# Patient Record
Sex: Male | Born: 1965 | Race: White | Hispanic: No | Marital: Married | Smoking: Never smoker
Health system: Southern US, Community
[De-identification: ages and names within clinical notes are randomized; demographics above are authoritative.]

## PROBLEM LIST (undated history)

## (undated) DIAGNOSIS — F909 Attention-deficit hyperactivity disorder, unspecified type: Secondary | ICD-10-CM

## (undated) DIAGNOSIS — G4733 Obstructive sleep apnea (adult) (pediatric): Secondary | ICD-10-CM

## (undated) HISTORY — PX: OTHER SURGICAL HISTORY: SHX169

## (undated) HISTORY — DX: Attention-deficit hyperactivity disorder, unspecified type: F90.9

## (undated) HISTORY — DX: Obstructive sleep apnea (adult) (pediatric): G47.33

---

## 2006-06-10 ENCOUNTER — Encounter: Admission: RE | Admit: 2006-06-10 | Discharge: 2006-06-10 | Payer: Self-pay | Admitting: Sports Medicine

## 2011-11-27 ENCOUNTER — Other Ambulatory Visit: Payer: Self-pay | Admitting: Internal Medicine

## 2011-11-27 DIAGNOSIS — R410 Disorientation, unspecified: Secondary | ICD-10-CM

## 2011-11-27 DIAGNOSIS — R519 Headache, unspecified: Secondary | ICD-10-CM

## 2011-11-29 ENCOUNTER — Ambulatory Visit
Admission: RE | Admit: 2011-11-29 | Discharge: 2011-11-29 | Disposition: A | Discharge status: Home / Self Care | Payer: BC Managed Care – PPO | Source: Ambulatory Visit | Attending: Internal Medicine | Admitting: Internal Medicine

## 2011-11-29 DIAGNOSIS — R519 Headache, unspecified: Secondary | ICD-10-CM

## 2011-11-29 DIAGNOSIS — R410 Disorientation, unspecified: Secondary | ICD-10-CM

## 2014-09-03 ENCOUNTER — Other Ambulatory Visit: Payer: Self-pay | Admitting: Internal Medicine

## 2014-09-03 DIAGNOSIS — R7989 Other specified abnormal findings of blood chemistry: Secondary | ICD-10-CM

## 2014-09-06 ENCOUNTER — Ambulatory Visit
Admission: RE | Admit: 2014-09-06 | Discharge: 2014-09-06 | Disposition: A | Discharge status: Home / Self Care | Payer: BC Managed Care – PPO | Source: Ambulatory Visit | Attending: Internal Medicine | Admitting: Internal Medicine

## 2014-09-06 DIAGNOSIS — R7989 Other specified abnormal findings of blood chemistry: Secondary | ICD-10-CM

## 2016-12-28 DIAGNOSIS — H5213 Myopia, bilateral: Secondary | ICD-10-CM | POA: Diagnosis not present

## 2017-03-25 DIAGNOSIS — Z79899 Other long term (current) drug therapy: Secondary | ICD-10-CM | POA: Diagnosis not present

## 2017-06-05 DIAGNOSIS — Z125 Encounter for screening for malignant neoplasm of prostate: Secondary | ICD-10-CM | POA: Diagnosis not present

## 2017-06-05 DIAGNOSIS — Z0001 Encounter for general adult medical examination with abnormal findings: Secondary | ICD-10-CM | POA: Diagnosis not present

## 2017-06-05 DIAGNOSIS — M109 Gout, unspecified: Secondary | ICD-10-CM | POA: Diagnosis not present

## 2017-06-11 DIAGNOSIS — Z0000 Encounter for general adult medical examination without abnormal findings: Secondary | ICD-10-CM | POA: Diagnosis not present

## 2017-06-11 DIAGNOSIS — N182 Chronic kidney disease, stage 2 (mild): Secondary | ICD-10-CM | POA: Diagnosis not present

## 2017-06-11 DIAGNOSIS — E785 Hyperlipidemia, unspecified: Secondary | ICD-10-CM | POA: Diagnosis not present

## 2017-06-21 DIAGNOSIS — Z79899 Other long term (current) drug therapy: Secondary | ICD-10-CM | POA: Diagnosis not present

## 2017-10-07 DIAGNOSIS — Z79899 Other long term (current) drug therapy: Secondary | ICD-10-CM | POA: Diagnosis not present

## 2017-11-06 DIAGNOSIS — R05 Cough: Secondary | ICD-10-CM | POA: Diagnosis not present

## 2017-11-06 DIAGNOSIS — J019 Acute sinusitis, unspecified: Secondary | ICD-10-CM | POA: Diagnosis not present

## 2018-01-06 DIAGNOSIS — S76311A Strain of muscle, fascia and tendon of the posterior muscle group at thigh level, right thigh, initial encounter: Secondary | ICD-10-CM | POA: Diagnosis not present

## 2018-02-04 DIAGNOSIS — Z79899 Other long term (current) drug therapy: Secondary | ICD-10-CM | POA: Diagnosis not present

## 2018-02-13 DIAGNOSIS — J309 Allergic rhinitis, unspecified: Secondary | ICD-10-CM | POA: Diagnosis not present

## 2018-02-13 DIAGNOSIS — J45909 Unspecified asthma, uncomplicated: Secondary | ICD-10-CM | POA: Diagnosis not present

## 2018-05-20 DIAGNOSIS — Z79899 Other long term (current) drug therapy: Secondary | ICD-10-CM | POA: Diagnosis not present

## 2018-06-03 DIAGNOSIS — N182 Chronic kidney disease, stage 2 (mild): Secondary | ICD-10-CM | POA: Diagnosis not present

## 2018-06-03 DIAGNOSIS — Z0000 Encounter for general adult medical examination without abnormal findings: Secondary | ICD-10-CM | POA: Diagnosis not present

## 2018-06-03 DIAGNOSIS — E785 Hyperlipidemia, unspecified: Secondary | ICD-10-CM | POA: Diagnosis not present

## 2018-06-06 DIAGNOSIS — Z0001 Encounter for general adult medical examination with abnormal findings: Secondary | ICD-10-CM | POA: Diagnosis not present

## 2018-06-06 DIAGNOSIS — D179 Benign lipomatous neoplasm, unspecified: Secondary | ICD-10-CM | POA: Diagnosis not present

## 2018-06-06 DIAGNOSIS — Z1212 Encounter for screening for malignant neoplasm of rectum: Secondary | ICD-10-CM | POA: Diagnosis not present

## 2018-06-06 DIAGNOSIS — M109 Gout, unspecified: Secondary | ICD-10-CM | POA: Diagnosis not present

## 2018-06-11 DIAGNOSIS — N183 Chronic kidney disease, stage 3 (moderate): Secondary | ICD-10-CM | POA: Diagnosis not present

## 2018-06-11 DIAGNOSIS — M109 Gout, unspecified: Secondary | ICD-10-CM | POA: Diagnosis not present

## 2018-07-09 DIAGNOSIS — Z79899 Other long term (current) drug therapy: Secondary | ICD-10-CM | POA: Diagnosis not present

## 2018-07-09 DIAGNOSIS — E785 Hyperlipidemia, unspecified: Secondary | ICD-10-CM | POA: Diagnosis not present

## 2018-07-09 DIAGNOSIS — M109 Gout, unspecified: Secondary | ICD-10-CM | POA: Diagnosis not present

## 2018-09-05 DIAGNOSIS — B079 Viral wart, unspecified: Secondary | ICD-10-CM | POA: Diagnosis not present

## 2018-09-10 DIAGNOSIS — M109 Gout, unspecified: Secondary | ICD-10-CM | POA: Diagnosis not present

## 2018-12-09 DIAGNOSIS — M109 Gout, unspecified: Secondary | ICD-10-CM | POA: Diagnosis not present

## 2018-12-09 DIAGNOSIS — J069 Acute upper respiratory infection, unspecified: Secondary | ICD-10-CM | POA: Diagnosis not present

## 2018-12-16 DIAGNOSIS — J069 Acute upper respiratory infection, unspecified: Secondary | ICD-10-CM | POA: Diagnosis not present

## 2019-01-19 DIAGNOSIS — H9201 Otalgia, right ear: Secondary | ICD-10-CM | POA: Diagnosis not present

## 2019-01-19 DIAGNOSIS — M26609 Unspecified temporomandibular joint disorder, unspecified side: Secondary | ICD-10-CM | POA: Diagnosis not present

## 2019-02-06 DIAGNOSIS — R6884 Jaw pain: Secondary | ICD-10-CM | POA: Diagnosis not present

## 2019-02-06 DIAGNOSIS — H9201 Otalgia, right ear: Secondary | ICD-10-CM | POA: Diagnosis not present

## 2019-03-09 DIAGNOSIS — R5383 Other fatigue: Secondary | ICD-10-CM | POA: Diagnosis not present

## 2019-03-09 DIAGNOSIS — Z0001 Encounter for general adult medical examination with abnormal findings: Secondary | ICD-10-CM | POA: Diagnosis not present

## 2019-03-09 DIAGNOSIS — Z125 Encounter for screening for malignant neoplasm of prostate: Secondary | ICD-10-CM | POA: Diagnosis not present

## 2019-03-12 DIAGNOSIS — Z20828 Contact with and (suspected) exposure to other viral communicable diseases: Secondary | ICD-10-CM | POA: Diagnosis not present

## 2019-03-12 DIAGNOSIS — Z0000 Encounter for general adult medical examination without abnormal findings: Secondary | ICD-10-CM | POA: Diagnosis not present

## 2019-03-12 DIAGNOSIS — Z1212 Encounter for screening for malignant neoplasm of rectum: Secondary | ICD-10-CM | POA: Diagnosis not present

## 2019-03-12 DIAGNOSIS — M109 Gout, unspecified: Secondary | ICD-10-CM | POA: Diagnosis not present

## 2019-03-12 DIAGNOSIS — Z23 Encounter for immunization: Secondary | ICD-10-CM | POA: Diagnosis not present

## 2019-03-13 DIAGNOSIS — B351 Tinea unguium: Secondary | ICD-10-CM | POA: Diagnosis not present

## 2019-03-13 DIAGNOSIS — M109 Gout, unspecified: Secondary | ICD-10-CM | POA: Diagnosis not present

## 2019-03-13 DIAGNOSIS — Z0000 Encounter for general adult medical examination without abnormal findings: Secondary | ICD-10-CM | POA: Diagnosis not present

## 2019-03-13 DIAGNOSIS — Z20828 Contact with and (suspected) exposure to other viral communicable diseases: Secondary | ICD-10-CM | POA: Diagnosis not present

## 2019-09-14 ENCOUNTER — Other Ambulatory Visit: Payer: Self-pay

## 2019-09-14 DIAGNOSIS — Z20822 Contact with and (suspected) exposure to covid-19: Secondary | ICD-10-CM

## 2019-09-15 LAB — NOVEL CORONAVIRUS, NAA: SARS-CoV-2, NAA: NOT DETECTED

## 2021-03-27 ENCOUNTER — Other Ambulatory Visit: Payer: Self-pay | Admitting: Internal Medicine

## 2021-03-27 DIAGNOSIS — E785 Hyperlipidemia, unspecified: Secondary | ICD-10-CM

## 2021-04-17 ENCOUNTER — Ambulatory Visit
Admission: RE | Admit: 2021-04-17 | Discharge: 2021-04-17 | Disposition: A | Discharge status: Home / Self Care | Payer: No Typology Code available for payment source | Source: Ambulatory Visit | Attending: Internal Medicine | Admitting: Internal Medicine

## 2021-04-17 DIAGNOSIS — E785 Hyperlipidemia, unspecified: Secondary | ICD-10-CM

## 2021-06-19 DIAGNOSIS — R000 Tachycardia, unspecified: Secondary | ICD-10-CM | POA: Insufficient documentation

## 2021-06-19 NOTE — Progress Notes (Signed)
Cardiology Office Note   Date:  06/20/2021   ID:  Greg Cunningham, DOB October 01, 1966, MRN [id number]  PCP:  Greg Pretty, MD  Cardiologist:   None Referring:  Greg Pretty, MD  Chief Complaint  Patient presents with   Palpitations       History of Present Illness: Greg Cunningham is a 55 y.o. male who is referred by Greg Pretty, MD for evaluaiton of tachycardia.  He wore a Zio.   I was able to review these results.  The predominant rhythm was sinus.  There was some premature atrial contractions.  There was 1 run of 5 beats of tachycardia which did appear to have a slightly wider complexes and some other runs of supraventricular tachycardia.  This might have represented 5 beats of nonsustained ventricular tachycardia.  There was some atrial ectopy.  There were no sustained arrhythmias.  He had a recent calcium score that was zero.    He says that his symptoms have not been palpitations in his neck.  He is really noticed on his Fitbit that it says atrial fibrillation but he does not record a rhythm strip.  He has had episodes where his heart rate is recorded in the 50s but is typically 100.  He said that might happen suddenly.  He does not really have presyncope or syncope.  He does not really describe chest pressure, neck or arm discomfort.  He is able to work vigorously in the yard without the symptoms.  He has had normal blood pressures.  He has had no new shortness of breath, PND or orthopnea.  He has had no weight gain or edema.   Past Medical History:  Diagnosis Date   ADHD     Past Surgical History:  Procedure Laterality Date   None       Current Outpatient Medications  Medication Sig Dispense Refill   Amphetamine ER (ADZENYS XR-ODT) 18.8 MG TBED Take 1 tablet by mouth every morning.     Biotin 10 MG TABS Take 10 mg by mouth every other day. 1 Tablet Every Other Day     magnesium oxide (MAG-OX) 400 MG tablet Take 400 mg by mouth every other day. 1 Tablet Every Other Day      melatonin 1 MG TABS tablet Take 1 mg by mouth at bedtime.     allopurinol (ZYLOPRIM) 300 MG tablet Take 300 mg by mouth daily.     Ascorbic Acid (VITAMIN C) 500 MG CAPS See admin instructions.     ciclopirox (PENLAC) 8 % solution 1 application     fluticasone furoate-vilanterol (BREO ELLIPTA) 100-25 MCG/INH AEPB INHALE ONE PUFF BY MOUTH DAILY     loratadine (CLARITIN) 10 MG tablet 1 tablet     No current facility-administered medications for this visit.    Allergies:   Patient has no allergy information on record.    Social History:  The patient  reports that he has never smoked. He has never been exposed to tobacco smoke. He has never used smokeless tobacco. He reports that he does not use drugs.   Family History:  The patient's family history is not on file.   No early CAD but he does not know his father history.     ROS:  Please see the history of present illness.   Otherwise, review of systems are positive for none.   All other systems are reviewed and negative.    PHYSICAL EXAM: VS:  BP 124/78   Pulse  93   Ht '5\' 9"'$  (1.753 m)   Wt 201 lb (91.2 kg)   SpO2 99%   BMI 29.68 kg/m  , BMI Body mass index is 29.68 kg/m. GENERAL:  Well appearing HEENT:  Pupils equal round and reactive, fundi not visualized, oral mucosa unremarkable NECK:  No jugular venous distention, waveform within normal limits, carotid upstroke brisk and symmetric, no bruits, no thyromegaly LYMPHATICS:  No cervical, inguinal adenopathy LUNGS:  Clear to auscultation bilaterally BACK:  No CVA tenderness CHEST:  Unremarkable HEART:  PMI not displaced or sustained,S1 and S2 within normal limits, no S3, no S4, no clicks, no rubs, no murmurs ABD:  Flat, positive bowel sounds normal in frequency in pitch, no bruits, no rebound, no guarding, no midline pulsatile mass, no hepatomegaly, no splenomegaly EXT:  2 plus pulses throughout, no edema, no cyanosis no clubbing SKIN:  No rashes no nodules NEURO:  Cranial  nerves II through XII grossly intact, motor grossly intact throughout PSYCH:  Cognitively intact, oriented to person place and time    EKG:  EKG is ordered today. The ekg ordered today demonstrates   Sinus rhythm, rate 93, axis within normal limits, intervals within normal limits, no acute ST-T wave changes.   Recent Labs: No results found for requested labs within last 8760 hours.    Lipid Panel No results found for: CHOL, TRIG, HDL, CHOLHDL, VLDL, LDLCALC, LDLDIRECT    Wt Readings from Last 3 Encounters:  06/20/21 201 lb (91.2 kg)      Other studies Reviewed: Additional studies/ records that were reviewed today include:   Event monitor. Review of the above records demonstrates:  Please see elsewhere in the note.     ASSESSMENT AND PLAN:  TACHYCARDIA: The patient has had SVT.  He has had very brief episodes of sinus bradycardia.  He has had some very brief wide-complex he might of been SVT or 5 beats of nonsustained ventricular tachycardia.  He has had atrial bigeminy which is probably explaining some of the sudden lower heart rates.  This is probably just and under counting of these ectopic beats.  We had a long discussion about this.  I am going to check an echocardiogram to make sure he has a structurally normal heart.  He is going to check with his primary provider to make sure he has had a recent TSH.  If these are normal I will plan to manage this symptomatically.  Toward that and he would probably want a first see if he should take another stimulant as this may be contributing.  Otherwise we might consider beta-blocker or calcium channel blocker and he will let me know.  At this point I do not have any evidence that he has atrial fibrillation and he be low thromboembolic risk if that was the case.  We did discuss him getting a Kardia's   Current medicines are reviewed at length with the patient today.  The patient does not have concerns regarding medicines.  The following  changes have been made: Patient  Labs/ tests ordered today include:   Orders Placed This Encounter  Procedures   EKG 12-Lead   ECHOCARDIOGRAM COMPLETE      Disposition:   FU with me as needed.      Signed, Minus Breeding, MD  06/20/2021 5:42 PM    [facility]

## 2021-06-20 ENCOUNTER — Other Ambulatory Visit: Payer: Self-pay

## 2021-06-20 ENCOUNTER — Ambulatory Visit (INDEPENDENT_AMBULATORY_CARE_PROVIDER_SITE_OTHER): Payer: 59 | Admitting: Cardiology

## 2021-06-20 ENCOUNTER — Encounter: Payer: Self-pay | Admitting: Cardiology

## 2021-06-20 VITALS — BP 124/78 | HR 93 | Ht 69.0 in | Wt 201.0 lb

## 2021-06-20 DIAGNOSIS — R000 Tachycardia, unspecified: Secondary | ICD-10-CM | POA: Diagnosis not present

## 2021-06-20 DIAGNOSIS — R002 Palpitations: Secondary | ICD-10-CM | POA: Diagnosis not present

## 2021-06-20 NOTE — Patient Instructions (Signed)
Medication Instructions:  No Changes In Medications at this time.  *If you need a refill on your cardiac medications before your next appointment, please call your pharmacy*  Testing/Procedures: Your physician has requested that you have an echocardiogram. Echocardiography is a painless test that uses sound waves to create images of your heart. It provides your doctor with information about the size and shape of your heart and how well your heart's chambers and valves are working. You may receive an ultrasound enhancing agent through an IV if needed to better visualize your heart during the echo.This procedure takes approximately one hour. There are no restrictions for this procedure. This will take place at the [address].   Follow-Up: At [facility], you and your health needs are our priority.  As part of our continuing mission to provide you with exceptional heart care, we have created designated Provider Care Teams.  These Care Teams include your primary Cardiologist (physician) and Advanced Practice Providers (APPs -  Physician Assistants and Nurse Practitioners) who all work together to provide you with the care you need, when you need it.  Your next appointment:   AS NEEDED   The format for your next appointment:   In Person  Provider:   Minus Breeding, MD

## 2021-06-22 ENCOUNTER — Ambulatory Visit (HOSPITAL_COMMUNITY): Payer: 59 | Attending: Cardiovascular Disease

## 2021-06-22 ENCOUNTER — Other Ambulatory Visit: Payer: Self-pay

## 2021-06-22 DIAGNOSIS — R002 Palpitations: Secondary | ICD-10-CM | POA: Diagnosis present

## 2021-06-22 DIAGNOSIS — R000 Tachycardia, unspecified: Secondary | ICD-10-CM | POA: Insufficient documentation

## 2021-06-22 LAB — ECHOCARDIOGRAM COMPLETE
Area-P 1/2: 3.24 cm2
S' Lateral: 2.2 cm

## 2021-06-28 ENCOUNTER — Encounter: Payer: Self-pay | Admitting: *Deleted

## 2021-06-30 ENCOUNTER — Ambulatory Visit: Payer: 59 | Admitting: Cardiology

## 2021-11-07 IMAGING — CT CT CARDIAC CORONARY ARTERY CALCIUM SCORE
3 series · 14 of 20 positions shown, 16 images · non-contrast
Comparison: No priors.

CLINICAL DATA: 55-year-old Caucasian male with history of elevated
cholesterol.

EXAM:
CT CARDIAC CORONARY ARTERY CALCIUM SCORE
TECHNIQUE: Non-contrast imaging through the heart was performed using
prospective ECG gating. Image post processing was performed on an
independent workstation, allowing for quantitative analysis of the
heart and coronary arteries. Note that this exam targets the heart
and the chest was not imaged in its entirety.

[Series 2: calcium scoring 2.00 qr36 bestdiast 70% hrt calciu · axial · 0.34mm/px · z∈[+1553,+1637]mm · 4 of 72 slices shown]
[im 15/72  vessel]
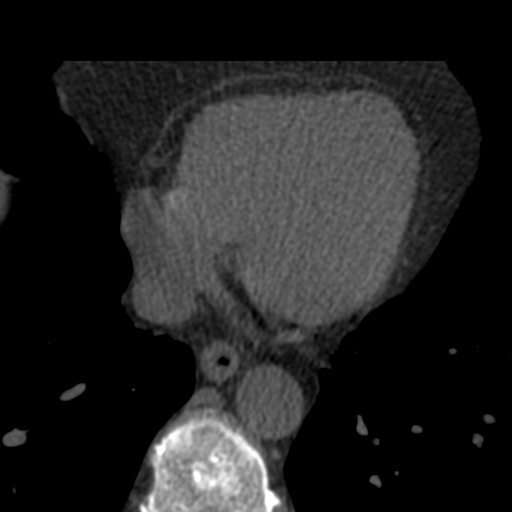
[im 29/72  vessel]
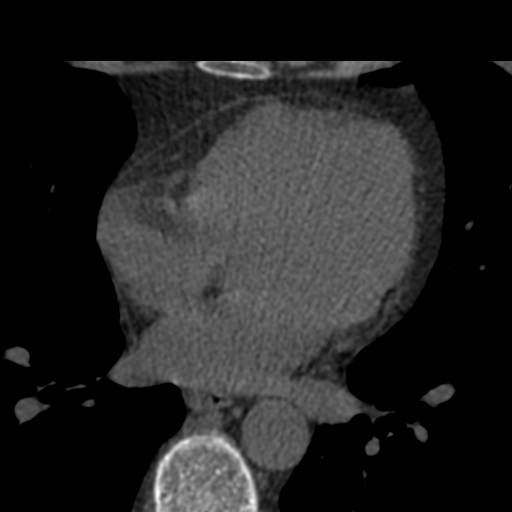
[im 43/72  vessel]
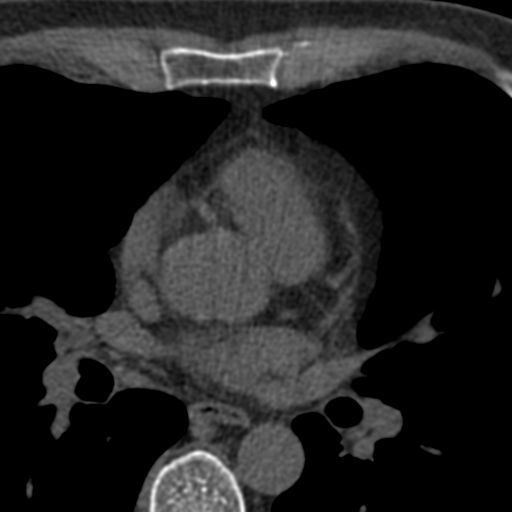
[im 57/72  vessel]
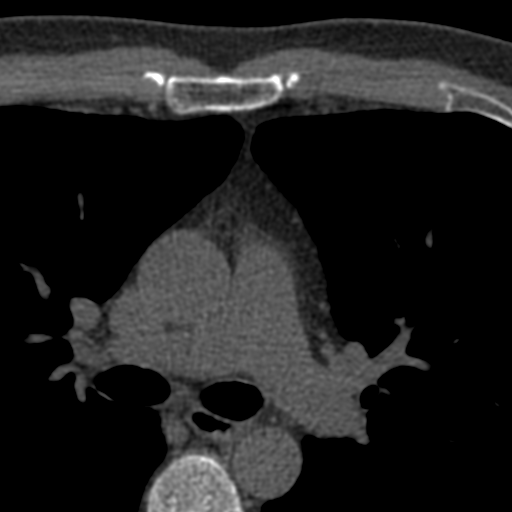

[Series 3: calcium scoring 2.00 br40 bestdiast 70% axial · axial · 0.60mm/px · z∈[+1547,+1643]mm · 5 of 72 slices shown, 7 images]
[im 12/72  vessel]
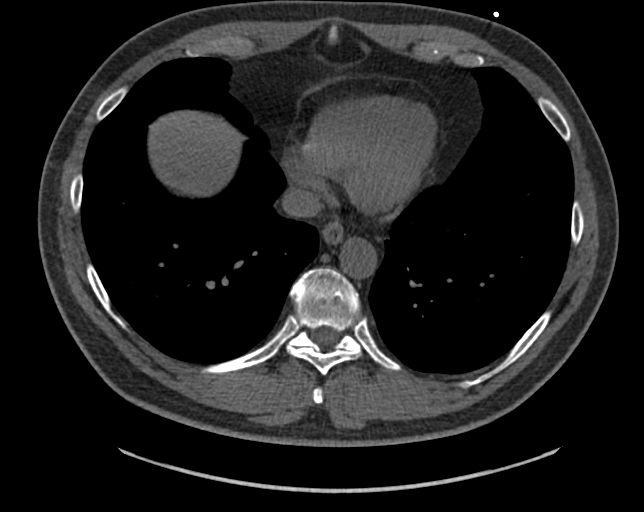
[im 12/72  lung]
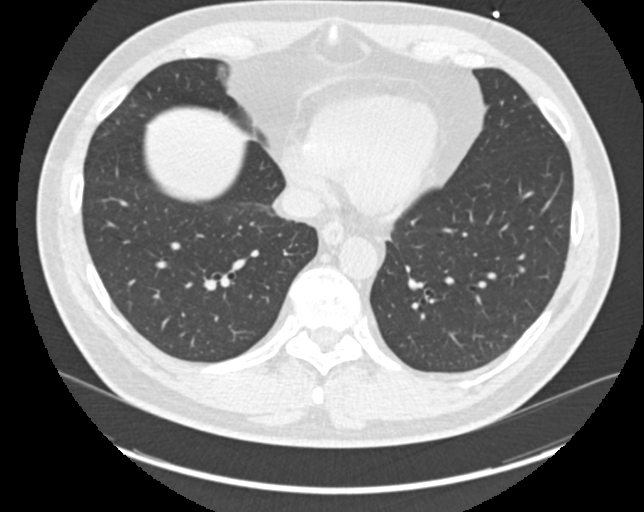
[im 24/72  vessel]
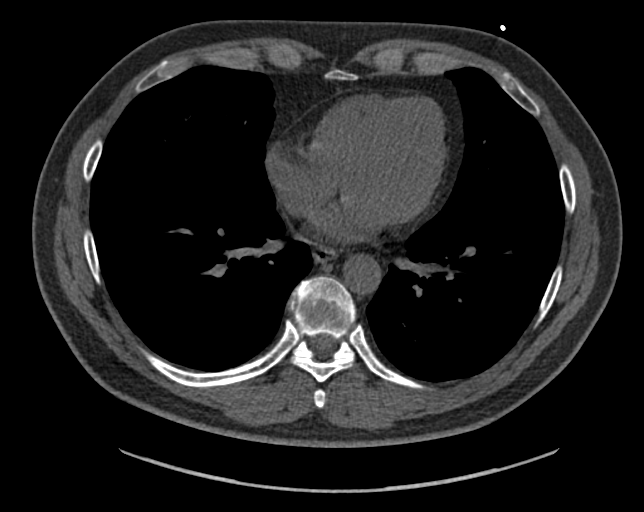
[im 36/72  vessel]
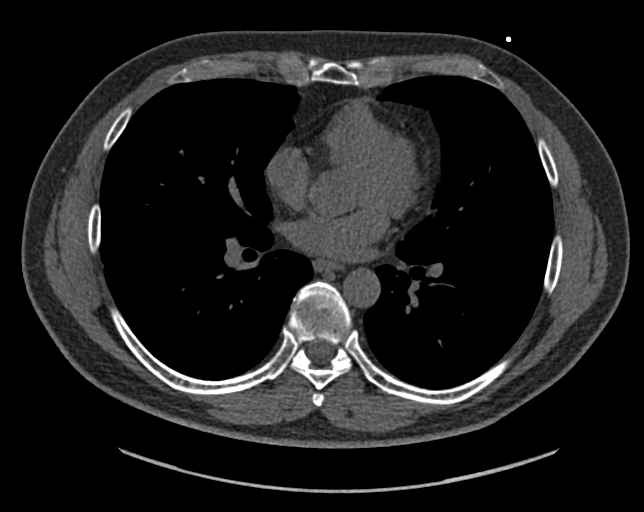
[im 48/72  vessel]
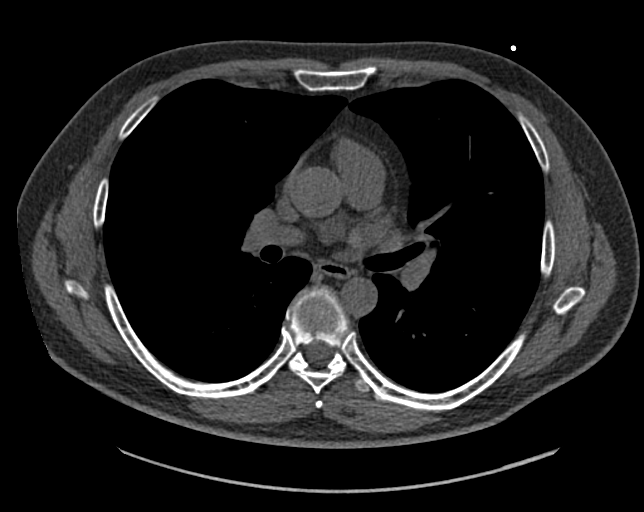
[im 60/72  vessel]
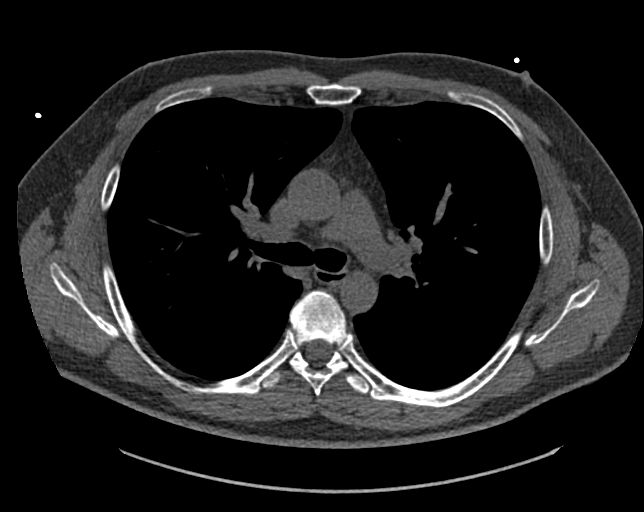
[im 60/72  lung]
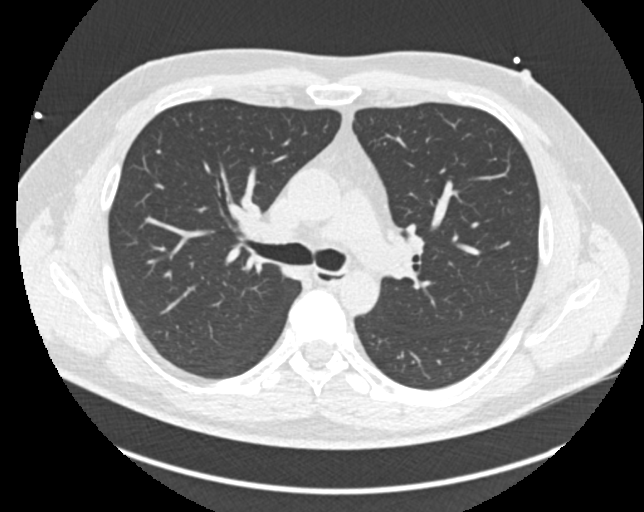

[Series 9: calcium scoring 2.00 br60 bestdiast 70% lungs · axial · 0.60mm/px · z∈[+1547,+1643]mm · 5 of 72 slices shown]
[im 12/72  vessel]
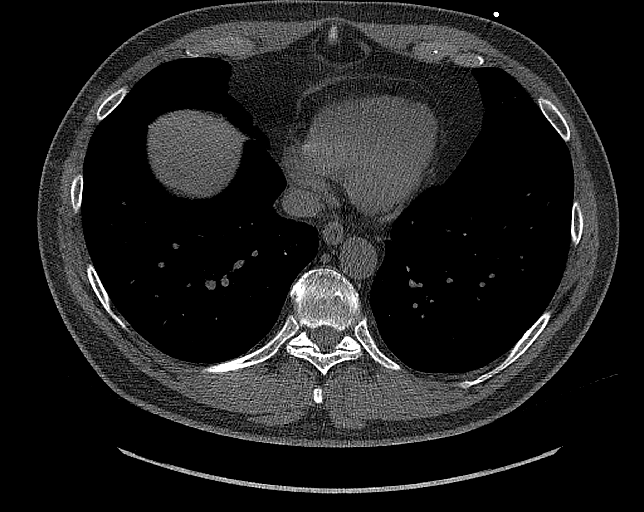
[im 24/72  vessel]
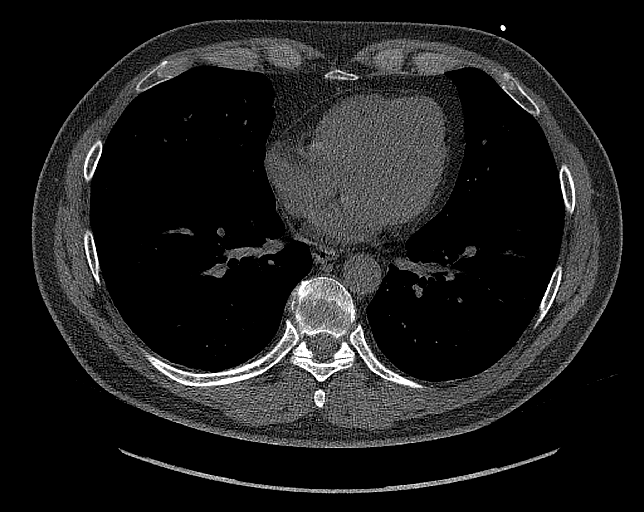
[im 36/72  vessel]
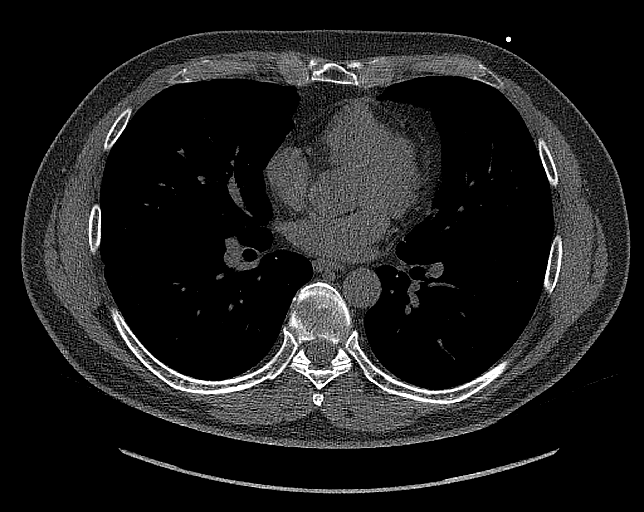
[im 48/72  vessel]
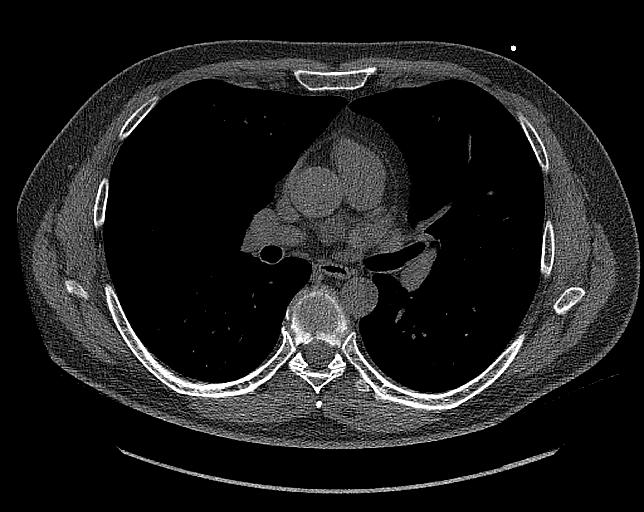
[im 60/72  vessel]
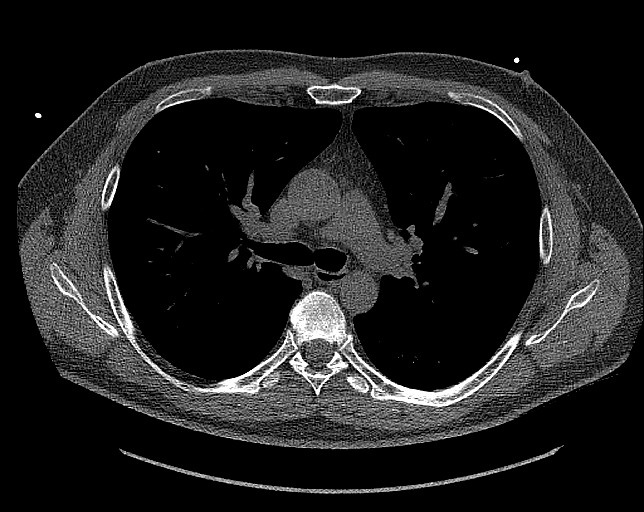

[14 of 20 positions shown; findings below may reference images not displayed]

FINDINGS: CORONARY CALCIUM SCORES:

Left Main: 0

LAD: 0

LCx: 0

RCA: 0

Total Agatston Score: 0

[HOSPITAL] percentile: N/A

AORTA MEASUREMENTS:

Ascending Aorta: 31 mm

Descending Aorta: 23 mm

EXTRACARDIAC FINDINGS:

2 mm pulmonary nodule in the periphery of the right lower lobe
(axial image 66 of series 3). Within the visualized portions of the
thorax there are no suspicious appearing pulmonary nodules or
masses, there is no acute consolidative airspace disease, no pleural
effusions, no pneumothorax and no lymphadenopathy. Visualized
portions of the upper abdomen are unremarkable. Well-circumscribed
2.3 cm fatty attenuation lesion in the right infraspinatus
musculature, compatible with an intramuscular lipoma. There are no
aggressive appearing lytic or blastic lesions noted in the
visualized portions of the skeleton.
IMPRESSION: 1. Patient's total coronary artery calcium score is 0 which
indicates a very low (but nonzero) risk of major adverse
cardiovascular events over the next 10 years.
2. 2 mm pulmonary nodule in the periphery of the right lower lobe,
nonspecific, but statistically likely benign. No follow-up needed if
patient is low-risk. Non-contrast chest CT can be considered in 12
months if patient is high-risk. This recommendation follows the
consensus statement: Guidelines for Management of Incidental
Pulmonary Nodules Detected on CT Images: From the [HOSPITAL]

## 2022-01-29 ENCOUNTER — Encounter: Payer: Self-pay | Admitting: Physician Assistant

## 2022-01-29 ENCOUNTER — Other Ambulatory Visit: Payer: Self-pay

## 2022-01-29 ENCOUNTER — Ambulatory Visit (INDEPENDENT_AMBULATORY_CARE_PROVIDER_SITE_OTHER): Payer: 59 | Admitting: Physician Assistant

## 2022-01-29 VITALS — BP 133/87 | HR 96 | Ht 68.0 in | Wt 204.6 lb

## 2022-01-29 DIAGNOSIS — I480 Paroxysmal atrial fibrillation: Secondary | ICD-10-CM

## 2022-01-29 DIAGNOSIS — F909 Attention-deficit hyperactivity disorder, unspecified type: Secondary | ICD-10-CM | POA: Diagnosis not present

## 2022-01-29 MED ORDER — METOPROLOL TARTRATE 25 MG PO TABS: ORAL_TABLET | ORAL | 3 refills | Status: DC

## 2022-01-29 NOTE — Patient Instructions (Addendum)
Medication Instructions:  ?TAKE Metoprolol Tartrate (Lopressor) 12.5 mg (1 tablet) every 6 hours for 2 doses during the daytime, may take additional 12.5 mg tablet at night if recurrent A-fib ?*If you need a refill on your cardiac medications before your next appointment, please call your pharmacy* ? ?Lab Work: ?NONE ordered at this time of appointment  ? ?If you have labs (blood work) drawn today and your tests are completely normal, you will receive your results only by: ?MyChart Message (if you have MyChart) OR ?A paper copy in the mail ?If you have any lab test that is abnormal or we need to change your treatment, we will call you to review the results. ? ?Testing/Procedures: ?NONE ordered at this time of appointment  ? ?Follow-Up: ?At [facility], you and your health needs are our priority.  As part of our continuing mission to provide you with exceptional heart care, we have created designated Provider Care Teams.  These Care Teams include your primary Cardiologist (physician) and Advanced Practice Providers (APPs -  Physician Assistants and Nurse Practitioners) who all work together to provide you with the care you need, when you need it. ? ?Your next appointment:   ?6 week(s) ? ?The format for your next appointment:   ?In Person ? ?Provider:   ?Minus Breeding, MD  or Almyra Deforest, PA-C      ? ?Other Instructions ?DO NOT drink any caffeine or alcohol drinks/beverages  ? ?

## 2022-01-29 NOTE — Progress Notes (Signed)
?Cardiology Office Note:   ? ?Date:  01/31/2022  ? ?ID:  Greg Cunningham, DOB June 03, 1966, MRN [id number] ? ?PCP:  [name], MD ?  ?[facility] Providers ?Cardiologist:  Minus Breeding, MD    ? ?Referring MD: [name], MD  ? ?Chief Complaint  ?Patient presents with  ? Irregular Heart Beat  ? ? ?History of Present Illness:   ? ?Greg Cunningham is a 56 y.o. male with a hx of ADHD and palpitation.  Patient was initially referred to [name] by his PCP for evaluation of tachycardia.  ZIO monitor showed predominantly sinus rhythm with premature atrial contraction, single episode of 5 beats of tachycardia which appears to have wider complex and some other runs of SVT.  There was no sustained arrhythmia.  Coronary calcium score was 0.  He was last seen by [name] in August 2022 at which time he was felt to be stable.  There was no limitation to his functional ability.  Echocardiogram obtained on 06/22/2021 showed EF 60 to 65%, no regional wall motion abnormality, no significant valve issue. ? ?Patient presents today for evaluation of possible atrial fibrillation.  EKG strip obtained based on his Apple Watch 01/25/2022 did reveal atrial fibrillation.  Rate controlled with heart rate in the 90 to low 100 range.  He says the palpitation lasted over an hour.  I reviewed the atrial fibrillation with [name], given his low CHA2DS2-Vasc score, we will hold off on starting him on anticoagulation therapy.  He does not have any history of high blood pressure, diabetes, peripheral arterial disease or CAD.  Heart rate sometimes dipped down to the 30s to 40s at night.  After discussing with [name], we recommended 2 doses of 12.5 mg of metoprolol tartrate during the day separated by 6 hours.  We will hold off on adding any metoprolol tartrate at night.  Although the patient may have the option of taking an extra dose of 12.5 mg of metoprolol tartrate if he does have recurrent A-fib at night.  I plan to see the  patient back in 6 weeks for reassessment.  [name] prefer not to add a chronic flecainide therapy for this patient if at all possible.  He may be a candidate for pill in the pocket flecainide in the future if he has prolonged episode of A-fib.  If he does have recurrence of A-fib, [name] would prefer to refer him to EP service to consider ablation procedure given his young age. ? ?Past Medical History:  ?Diagnosis Date  ? ADHD   ? ? ?Past Surgical History:  ?Procedure Laterality Date  ? None    ? ? ?Current Medications: ?Current Meds  ?Medication Sig  ? allopurinol (ZYLOPRIM) 300 MG tablet Take 300 mg by mouth daily.  ? Amphetamine ER (ADZENYS XR-ODT) 18.8 MG TBED Take 1 tablet by mouth every morning.  ? Ascorbic Acid (VITAMIN C) 500 MG CAPS See admin instructions.  ? Biotin 10 MG TABS Take 10 mg by mouth every other day. 1 Tablet Every Other Day  ? ciclopirox (PENLAC) 8 % solution 1 application  ? fluticasone furoate-vilanterol (BREO ELLIPTA) 100-25 MCG/INH AEPB INHALE ONE PUFF BY MOUTH DAILY  ? loratadine (CLARITIN) 10 MG tablet 1 tablet  ? melatonin 1 MG TABS tablet Take 1 mg by mouth at bedtime.  ? metoprolol tartrate (LOPRESSOR) 25 MG tablet Take 1 tablet every 6 hours for 2 doses during the daytime, may take additional 12.5 mg tablet at  night if recurrent A-fib  ?  ? ?Allergies:   Patient has no allergy information on record.  ? ?Social History  ? ?Socioeconomic History  ? Marital status: Married  ?  Spouse name: Not on file  ? Number of children: Not on file  ? Years of education: Not on file  ? Highest education level: Not on file  ?Occupational History  ? Not on file  ?Tobacco Use  ? Smoking status: Never  ?  Passive exposure: Never  ? Smokeless tobacco: Never  ?Substance and Sexual Activity  ? Alcohol use: Not on file  ? Drug use: Never  ? Sexual activity: Yes  ?  Partners: Female  ?Other Topics Concern  ? Not on file  ?Social History Narrative  ? Not on file  ? ?Social Determinants of Health   ? ?Financial Resource Strain: Not on file  ?Food Insecurity: Not on file  ?Transportation Needs: Not on file  ?Physical Activity: Not on file  ?Stress: Not on file  ?Social Connections: Not on file  ?  ? ?Family History: ?The patient's family history is not on file. ? ?ROS:   ?Please see the history of present illness.    ? All other systems reviewed and are negative. ? ?EKGs/Labs/Other Studies Reviewed:   ? ?The following studies were reviewed today: ? ?Echo 06/22/2021 ? 1. Left ventricular ejection fraction, by estimation, is 60 to 65%. The  ?left ventricle has normal function. The left ventricle has no regional  ?wall motion abnormalities. Left ventricular diastolic parameters were  ?normal.  ? 2. Right ventricular systolic function is normal. The right ventricular  ?size is normal. Tricuspid regurgitation signal is inadequate for assessing  ?PA pressure.  ? 3. The mitral valve is grossly normal. No evidence of mitral valve  ?regurgitation. No evidence of mitral stenosis.  ? 4. The aortic valve is tricuspid. Aortic valve regurgitation is not  ?visualized. No aortic stenosis is present.  ? 5. The inferior vena cava is normal in size with greater than 50%  ?respiratory variability, suggesting right atrial pressure of 3 mmHg.  ? ?Conclusion(s)/Recommendation(s): Normal biventricular function without  ?evidence of hemodynamically significant valvular heart disease.  ? ?EKG:  EKG is ordered today.  The ekg ordered today demonstrates normal sinus rhythm, no significant ST-T wave changes ? ?Recent Labs: ?No results found for requested labs within last 8760 hours.  ?Recent Lipid Panel ?No results found for: CHOL, TRIG, HDL, CHOLHDL, VLDL, LDLCALC, LDLDIRECT ? ? ?Risk Assessment/Calculations:   ? ?CHA2DS2-VASc Score = 0  ? This indicates a 0.2% annual risk of stroke. ?The patient's score is based upon: ?CHF History: 0 ?HTN History: 0 ?Diabetes History: 0 ?Stroke History: 0 ?Vascular Disease History: 0 ?Age Score:  0 ?Gender Score: 0 ?  ?  ? ?    ? ?Physical Exam:   ? ?VS:  BP 133/87   Pulse 96   Ht '5\' 8"'$  (1.727 m)   Wt 204 lb 9.6 oz (92.8 kg)   SpO2 99%   BMI 31.11 kg/m?    ? ?Wt Readings from Last 3 Encounters:  ?01/29/22 204 lb 9.6 oz (92.8 kg)  ?06/20/21 201 lb (91.2 kg)  ?  ? ?GEN:  Well nourished, well developed in no acute distress ?HEENT: Normal ?NECK: No JVD; No carotid bruits ?LYMPHATICS: No lymphadenopathy ?CARDIAC: RRR, no murmurs, rubs, gallops ?RESPIRATORY:  Clear to auscultation without rales, wheezing or rhonchi  ?ABDOMEN: Soft, non-tender, non-distended ?MUSCULOSKELETAL:  No edema; No deformity  ?SKIN:  Warm and dry ?NEUROLOGIC:  Alert and oriented x 3 ?PSYCHIATRIC:  Normal affect  ? ?ASSESSMENT:   ? ?1. PAF (paroxysmal atrial fibrillation) ([place])   ?2. Attention deficit hyperactivity disorder (ADHD), unspecified ADHD type   ? ?PLAN:   ? ?In order of problems listed above: ? ?PAF: caught on her Apple Watch.  Tracing reviewed with [name].  He does have occasional nocturnal bradycardia with heart rate down to the 30s.  [name] recommended 2 doses of metoprolol tartrate during the day separated by 6 hours.  He may use as needed dose of metoprolol tartrate if he has recurrent atrial fibrillation. CHA2DS2-Vasc score low, will hold off on anticoagulation therapy at this time. ? ?ADHD: For now, I will continue his ADHD meds despite recurrence of atrial fibrillation. ? ?   ? ?   ? ? ?Medication Adjustments/Labs and Tests Ordered: ?Current medicines are reviewed at length with the patient today.  Concerns regarding medicines are outlined above.  ?Orders Placed This Encounter  ?Procedures  ? EKG 12-Lead  ? ?Meds ordered this encounter  ?Medications  ? metoprolol tartrate (LOPRESSOR) 25 MG tablet  ?  Sig: Take 1 tablet every 6 hours for 2 doses during the daytime, may take additional 12.5 mg tablet at night if recurrent A-fib  ?  Dispense:  90 tablet  ?  Refill:  3  ? ? ?Patient Instructions  ?Medication  Instructions:  ?TAKE Metoprolol Tartrate (Lopressor) 12.5 mg (1 tablet) every 6 hours for 2 doses during the daytime, may take additional 12.5 mg tablet at night if recurrent A-fib ?*If you need a refill on your cardi

## 2022-01-30 NOTE — Telephone Encounter (Signed)
Clarification given to pt re message Pt is to take 12.5 mg of Metoprolol  bid separated by 6 hours and may take an additional 12.5 mg if needed at night  ?

## 2022-03-07 ENCOUNTER — Ambulatory Visit: Payer: 59 | Admitting: Licensed Clinical Social Worker

## 2022-03-07 DIAGNOSIS — F430 Acute stress reaction: Secondary | ICD-10-CM

## 2022-03-07 NOTE — BH Specialist Note (Signed)
Integrated Behavioral Health Initial In-Person Visit ? ?MRN: [id number] ?Name: Greg Cunningham ? ?Number of [facility] Clinician visits: 1- Initial Visit ? ?Session Start time: 0900 ?   ?Session End time: 1000 ? ?Total time in minutes: 60 ? ? ?Types of Service: Individual psychotherapy ? ?Interpretor:No. Interpretor Name and Language: NA ? ? Warm Hand Off Completed. ? ?  ? ?  ? ? ?Subjective: ?Greg Cunningham is a 56 y.o. male accompanied by  Self ?Patient was referred by Sharene Butters for Caregiver Distress and resource utilization  . ?Patient reports the following symptoms/concerns: Feelings of being stressed, overwhelmed, with caring for his mother who was recently diagnosed with cognitive impairment  ?Duration of problem: Several months; Severity of problem: mild ? ?Objective: ?Mood: Anxious and Stressed  and Affect: Appropriate ?Risk of harm to self or others: No plan to harm self or others ? ?Life Context: ?Family and Social: Pt is currently out of work and will start back to work and does have family of his own in addition to caring for his mother  ?School/Work: Pt is currently out of work and will start back to work and does have family of his own in addition to caring for his mother  ?Self-Care: He is learning and we discussed the importance of self care in being a caregiver .  ?Life Changes: Pt's mother was recently DX with cognitive impairment which the burden has been place on Mr. Shahan for care and coordination of care for her.   ? ?Patient and/or Family's Strengths/Protective Factors: ?Social connections and [place] supports in place (healthy food, safe environments, etc.) ? ?Goals Addressed: ?Patient will: ?Reduce symptoms of: anxiety and stress ?Increase knowledge and/or ability of: coping skills and stress reduction  ?Demonstrate ability to: Increase healthy adjustment to current life circumstances ? ?Progress towards Goals: ?Ongoing ? ?Interventions: ?Interventions utilized:  Supportive Counseling and Link to [employer]  ?Standardized Assessments completed: Not Needed ? ?Patient and/or Family Response: Pt open to resources and interventions provided  ? ?Patient Centered Plan: ?Patient is on the following Treatment Plan(s):  Increase knowledge of mother's condition, and challenges that lay ahead, maximize the use of formal and informal support system.  Look at [employer] for medicaid options.  Support group options and Adult day center options and in home options for the overall care needs of mother. ? ?Assessment: ?Patient currently experiencing Stress, and burden associated with care of his mother who has recent DX of cognitive impairment.  . ?  ?Patient may benefit from Gain knowledge of mother condition, and how to handle challenges of caregiving.  Furthermore, to increase the use of formal and informal support systems.  Look at in home support and out of home support with support groups, Adult day centers and ways of decreasing the burden and stress for this caregiver.  . ? ?Plan: ?Follow up with behavioral health clinician on : As needed  ?Behavioral recommendations: Follow up with community resources such as [facility], Adult [facility] Centers including [facility], [facility], Follow up with Support group options and increase education of Dementia/Alzheimer's   ?Referral(s): Community Resources:  Programmer, multimedia, Adult Day Centers including [facility], [facility], Follow up with Support group options ?"From scale of 1-10, how likely are you to follow plan?": 10 ? ?Greg Bodiford A Taylor-Paladino, LCSW ? ? ? ? ? ? ? ? ?

## 2022-03-11 DIAGNOSIS — I480 Paroxysmal atrial fibrillation: Secondary | ICD-10-CM | POA: Insufficient documentation

## 2022-03-11 NOTE — Progress Notes (Signed)
?  ?Cardiology Office Note ? ? ?Date:  03/12/2022  ? ?ID:  [name], DOB 18-Jul-1966, MRN [id number] ? ?PCP:  [name], MD  ?Cardiologist:   Minus Breeding, MD ?Referring:  [name], MD ? ?Chief Complaint  ?Patient presents with  ? Atrial Fibrillation  ? ? ? ?  ?History of Present Illness: ?Greg Cunningham is a 56 y.o. male who is referred by [name], MD for evaluaiton of tachycardia.  He was found earlier this year on a Watch to have PAF.  He says he notices this rarely.  He notices it more at night.  He does bring in a strip today that definitely is atrial fibrillation with a rate of 90.  Other times his heart rate is in the 50s well below 90s.  He has some premature beats and his heart rate is under counted at times.  He denies any presyncope or syncope.  He is not having any chest pressure, neck or arm discomfort.  He is not having any weight gain or edema.  He is being physically active.  He does have emotional stress. ? ? ?Past Medical History:  ?Diagnosis Date  ? ADHD   ? ? ?Past Surgical History:  ?Procedure Laterality Date  ? None    ? ? ? ?Current Outpatient Medications  ?Medication Sig Dispense Refill  ? allopurinol (ZYLOPRIM) 300 MG tablet Take 300 mg by mouth daily.    ? Amphetamine ER (ADZENYS XR-ODT) 18.8 MG TBED Take 1 tablet by mouth every morning.    ? Ascorbic Acid (VITAMIN C) 500 MG CAPS See admin instructions.    ? Biotin 10 MG TABS Take 10 mg by mouth every other day. 1 Tablet Every Other Day    ? ciclopirox (PENLAC) 8 % solution 1 application    ? fluticasone furoate-vilanterol (BREO ELLIPTA) 100-25 MCG/INH AEPB INHALE ONE PUFF BY MOUTH DAILY    ? loratadine (CLARITIN) 10 MG tablet 1 tablet    ? melatonin 1 MG TABS tablet Take 1 mg by mouth at bedtime.    ? metoprolol tartrate (LOPRESSOR) 25 MG tablet Take 1 tablet every 6 hours for 2 doses during the daytime, may take additional 12.5 mg tablet at night if recurrent A-fib 90 tablet 3  ? ?No current facility-administered  medications for this visit.  ? ? ?Allergies:   Patient has no allergy information on record.  ? ? ?ROS:  Please see the history of present illness.   Otherwise, review of systems are positive for none.   All other systems are reviewed and negative.  ? ? ?PHYSICAL EXAM: ?VS:  BP 124/72   Pulse 77   Ht '5\' 8"'$  (1.727 m)   Wt 204 lb 6.4 oz (92.7 kg)   SpO2 98%   BMI 31.08 kg/m?  , BMI Body mass index is 31.08 kg/m?. ?GENERAL:  Well appearing ?NECK:  No jugular venous distention, waveform within normal limits, carotid upstroke brisk and symmetric, no bruits, no thyromegaly ?LUNGS:  Clear to auscultation bilaterally ?CHEST:  Unremarkable ?HEART:  PMI not displaced or sustained,S1 and S2 within normal limits, no S3, no S4, no clicks, no rubs, no murmurs ?ABD:  Flat, positive bowel sounds normal in frequency in pitch, no bruits, no rebound, no guarding, no midline pulsatile mass, no hepatomegaly, no splenomegaly ?EXT:  2 plus pulses throughout, no edema, no cyanosis no clubbing ? ? ? ?EKG:  EKG is not ordered today. ? ? ? ?Recent Labs: ?No results found for  requested labs within last 8760 hours.  ? ? ?Lipid Panel ?No results found for: CHOL, TRIG, HDL, CHOLHDL, VLDL, LDLCALC, LDLDIRECT ?  ? ?Wt Readings from Last 3 Encounters:  ?03/12/22 204 lb 6.4 oz (92.7 kg)  ?01/29/22 204 lb 9.6 oz (92.8 kg)  ?06/20/21 201 lb (91.2 kg)  ?  ? ? ?Other studies Reviewed: ?Additional studies/ records that were reviewed today include:   Home recordings ?Review of the above records demonstrates:  Please see elsewhere in the note.   ? ? ?ASSESSMENT AND PLAN: ? ?TACHYCARDIA:    He has PAF.   Mr. Sriram Febles Lobosco has a CHA2DS2 - VASc score of 0.   At this point no change in therapy is indicated.  He is not having particular symptoms.  He does need his metoprolol 12.5 mg twice daily and occasionally if he is having some palpitations he will take the extra dose.  ? ?SNORING:  STOP BANG was 4.  With this in the atrial fibrillation I will order a  home sleep study. ? ? ?Current medicines are reviewed at length with the patient today.  The patient does not have concerns regarding medicines. ? ?The following changes have been made: None ? ?Labs/ tests ordered today include: None ? ?Orders Placed This Encounter  ?Procedures  ? [facility] Sleep Study  ? ? ? ? ?Disposition:   FU with me in one year.  ? ?Signed, ?Minus Breeding, MD  ?03/12/2022 10:24 AM    ?[place] ? ? ? ?

## 2022-03-12 ENCOUNTER — Ambulatory Visit (INDEPENDENT_AMBULATORY_CARE_PROVIDER_SITE_OTHER): Payer: 59 | Admitting: Cardiology

## 2022-03-12 ENCOUNTER — Telehealth: Payer: Self-pay

## 2022-03-12 ENCOUNTER — Encounter: Payer: Self-pay | Admitting: Cardiology

## 2022-03-12 VITALS — BP 124/72 | HR 77 | Ht 68.0 in | Wt 204.4 lb

## 2022-03-12 DIAGNOSIS — R0683 Snoring: Secondary | ICD-10-CM | POA: Diagnosis not present

## 2022-03-12 DIAGNOSIS — I480 Paroxysmal atrial fibrillation: Secondary | ICD-10-CM

## 2022-03-12 MED ORDER — METOPROLOL TARTRATE 25 MG PO TABS: ORAL_TABLET | ORAL | 3 refills | Status: DC

## 2022-03-12 NOTE — Patient Instructions (Signed)
Medication Instructions:  ?Your physician recommends that you continue on your current medications as directed. Please refer to the Current Medication list given to you today. ? ?*If you need a refill on your cardiac medications before your next appointment, please call your pharmacy* ? ? ?Lab Work: ?NONE ordered at this time of appointment  ? ?If you have labs (blood work) drawn today and your tests are completely normal, you will receive your results only by: ?MyChart Message (if you have MyChart) OR ?A paper copy in the mail ?If you have any lab test that is abnormal or we need to change your treatment, we will call you to review the results. ? ? ?Testing/Procedures: ?Your physician has recommended that you have a sleep study. This test records several body functions during sleep, including: brain activity, eye movement, oxygen and carbon dioxide blood levels, heart rate and rhythm, breathing rate and rhythm, the flow of air through your mouth and nose, snoring, body muscle movements, and chest and belly movement.  ? ? ?Follow-Up: ?At [facility], you and your health needs are our priority.  As part of our continuing mission to provide you with exceptional heart care, we have created designated Provider Care Teams.  These Care Teams include your primary Cardiologist (physician) and Advanced Practice Providers (APPs -  Physician Assistants and Nurse Practitioners) who all work together to provide you with the care you need, when you need it. ? ?We recommend signing up for the patient portal called "MyChart".  Sign up information is provided on this After Visit Summary.  MyChart is used to connect with patients for Virtual Visits (Telemedicine).  Patients are able to view lab/test results, encounter notes, upcoming appointments, etc.  Non-urgent messages can be sent to your provider as well.   ?To learn more about what you can do with MyChart, go to [email or web address].   ? ?Your next appointment:   ?1  year(s) ? ?The format for your next appointment:   ?In Person ? ?Provider:   ?Minus Breeding, MD   ? ? ?Other Instructions ? ? ?Important Information About Sugar ? ? ? ? ? ? ?

## 2022-03-12 NOTE — Telephone Encounter (Signed)
Patient presented to the office for [facility] set-up. Education provided and StopBang completed with a SB score of 5. Per Mariann Laster, no pre-cert required. Patient aware that he may proceed with the [facility] Sleep Study. PIN # provided to the patient. Patient made aware that he will be contacted after the test has been read with the results and any recommendations. Patient verbalized understanding.  ? ?

## 2022-03-13 ENCOUNTER — Encounter (INDEPENDENT_AMBULATORY_CARE_PROVIDER_SITE_OTHER): Payer: 59 | Admitting: Cardiology

## 2022-03-13 DIAGNOSIS — G4733 Obstructive sleep apnea (adult) (pediatric): Secondary | ICD-10-CM

## 2022-03-14 ENCOUNTER — Ambulatory Visit: Payer: 59

## 2022-03-14 DIAGNOSIS — R0683 Snoring: Secondary | ICD-10-CM

## 2022-03-14 DIAGNOSIS — I480 Paroxysmal atrial fibrillation: Secondary | ICD-10-CM

## 2022-03-14 NOTE — Procedures (Signed)
? ?  SLEEP STUDY REPORT ?Patient Information ?Study Date: 03/13/22 ?Patient Name: Greg Cunningham ?Patient ID: [id number] ?Birth Date: Aug 12, 2066 ?Age: 56 ?Gender: Male ?BMI: 31.1 (W=205 lb, H=5' 8'') ?Referring Physician: Marijo File, MD ? ?TEST DESCRIPTION: Home sleep apnea testing was completed using the WatchPat, a Type 1 device, utilizing  ?peripheral arterial tonometry (PAT), chest movement, actigraphy, pulse oximetry, pulse rate, body position and snore.  ?AHI was calculated with apnea and hypopnea using valid sleep time as the denominator. RDI includes apneas,  ?hypopneas, and RERAs. The data acquired and the scoring of sleep and all associated events were performed in  ?accordance with the recommended standards and specifications as outlined in the AASM Manual for the Scoring of  ?Sleep and Associated Events 2.2.0 (2015).  ? ?FINDINGS:  ?1. Mild Obstructive Sleep Apnea with AHI 10.4/hr.  ?2. No Central Sleep Apnea with pAHIc 0.7/hr.  ?3. Oxygen desaturations as low as 88%.  ?4. Mild snoring was present. O2 sats were < 88% for 0.1 min.  ?5. Total sleep time was 7 hrs and 38 min.  ?6. 34.9% of total sleep time was spent in REM sleep.  ?7. Normal sleep onset latency at 24 min.  ?8. Shortened REM sleep onset latency at 46 min.  ?9. Total awakenings were 6 .  ? 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 ?RECOMMENDATIONS: ?1. Clinical correlation of these findings is necessary. The decision to treat obstructive sleep apnea (OSA) is usually  ?based on the presence of apnea symptoms or the presence of associated medical conditions such as Hypertension,  ?Congestive Heart Failure, Atrial Fibrillation or Obesity. The most common symptoms of OSA are snoring, gasping for  ?breath while sleeping, daytime sleepiness and fatigue.  ? ?2. Initiating apnea therapy is recommended given the presence of symptoms and/or associated conditions.  ?Recommend proceeding with one of the following: ? ? a. Auto-CPAP therapy with a  pressure range of 5-20cm H2O. ? ? b. An oral appliance (OA) that can be obtained from certain dentists with expertise in sleep medicine. These are  ?primarily of use in non-obese patients with mild and moderate disease. ? ? c. An ENT consultation which may be useful to look for specific causes of obstruction and possible treatment  ?options. ? ? d. If patient is intolerant to PAP therapy, consider referral to ENT for evaluation for hypoglossal nerve stimulator.  ? ?3. Close follow-up is necessary to ensure success with CPAP or oral appliance therapy for maximum benefit . ? ?4. A follow-up oximetry study on CPAP is recommended to assess the adequacy of therapy and determine the need  ?for supplemental oxygen or the potential need for Bi-level therapy. An arterial blood gas to determine the adequacy of  ?baseline ventilation and oxygenation should also be considered. ? ?5. Healthy sleep recommendations include: adequate nightly sleep (normal 7-9 hrs/night), avoidance of caffeine after  ?noon and alcohol near bedtime, and maintaining a sleep environment that is cool, dark and quiet. ? ?6. Weight loss for overweight patients is recommended. Even modest amounts of weight loss can significantly  ?improve the severity of sleep apnea. ? ?7. Snoring recommendations include: weight loss where appropriate, side sleeping, and avoidance of alcohol before  ?bed. ? ?8. Operation of motor vehicle should be avoided when sleepy. ? ?Signature: ?Electronically Signed: 03/14/22 ?Fransico Him, MD; [facility]; [place], [place] Board of  Sleep Medicine ? ?

## 2022-03-19 ENCOUNTER — Encounter: Payer: Self-pay | Admitting: Cardiology

## 2022-03-22 ENCOUNTER — Telehealth: Payer: Self-pay | Admitting: Cardiology

## 2022-03-22 NOTE — Telephone Encounter (Signed)
Patient contacted in regards to sleep study.  Dr.Turner read results. I will send over to [facility] street sleep coordinator.   Thanks!

## 2022-03-22 NOTE — Telephone Encounter (Signed)
Pt calling in regards to Sleep Study results. He would like a callback. Please advise

## 2022-03-26 ENCOUNTER — Telehealth: Payer: Self-pay | Admitting: *Deleted

## 2022-03-26 DIAGNOSIS — G4733 Obstructive sleep apnea (adult) (pediatric): Secondary | ICD-10-CM

## 2022-03-26 NOTE — Telephone Encounter (Addendum)
The patient has been notified of the result and verbalized understanding.  All questions (if any) were answered. Marolyn Hammock, [place] 03/26/2022 11:55 AM    Upon patient request DME selection is [facility] Patient understands he will be contacted by [facility] to set up his cpap. Patient understands to call if [facility] does not contact him with new setup in a timely manner. Patient understands they will be called once confirmation has been received from [employer]/ that they have received their new machine to schedule 10 week follow up appointment.   [facility] notified of new cpap order  Please add to airview Patient was grateful for the call and thanked me.

## 2022-03-26 NOTE — Telephone Encounter (Addendum)
CC'd Chart Routing History  Routing History  From: Sueanne Margarita, MD On: 03/14/2022 08:27 AM  To: [facility] Sleep Studies (Pool)  Priority: Routine  Routing Comments:  Please let patient know that they have sleep apnea and recommend treating with CPAP.  Please order an auto CPAP from 4-15cm H2O with heated humidity and mask of choice.  Order overnight pulse ox on CPAP.  Followup with me in 6 weeks.

## 2022-03-26 NOTE — Telephone Encounter (Signed)
The patient has been notified of the result and verbalized understanding.  All questions (if any) were answered. Marolyn Hammock, [place] 03/26/2022 5:50 PM

## 2022-04-25 ENCOUNTER — Telehealth: Payer: Self-pay | Admitting: Cardiology

## 2022-04-25 NOTE — Telephone Encounter (Signed)
What problem are you experiencing?  Patient states he is feeling tired and fatigued and he is not sleeping well  Who is your medical equipment company?   [employer]   Please route to the sleep study assistant.

## 2022-04-26 ENCOUNTER — Other Ambulatory Visit: Payer: Self-pay | Admitting: Physician Assistant

## 2022-04-26 ENCOUNTER — Encounter: Payer: Self-pay | Admitting: Cardiology

## 2022-04-26 DIAGNOSIS — I480 Paroxysmal atrial fibrillation: Secondary | ICD-10-CM

## 2022-04-26 NOTE — Telephone Encounter (Signed)
Patient reports he is more tired now in the mornings than before he started using his cpap. He thinks it is working but he feels it is keeping him from getting restful or deep sleep. Is this a process to get use to the cpap or do have any suggestions he can try. He has been sleeping on his back because sleeping on his side breaks the seal and he tried sleeping with his mask off in the early morning but that did not help either. He feels very fatigued. See his down load below.  Greg Cunningham, Greg Cunningham 03/27/2022 - 04/25/2022 Patient ID: [id number] DOB: 28-Apr-1966 Age: 56 years 30.12 [place] [facility] [place] Compliance Report Usage 03/27/2022 - 04/25/2022 Usage days 13/30 days (43%) >= 4 hours 12 days (40%) < 4 hours 1 days (3%) Usage hours 93 hours 18 minutes Average usage (total days) 3 hours 7 minutes Average usage (days used) 7 hours 11 minutes Median usage (days used) 7 hours 12 minutes Total used hours (value since last reset - 04/25/2022) 92 hours AirSense 10 AutoSet Serial number [id number] Mode AutoSet Min Pressure 4.6 cmH2O Max Pressure 15 cmH2O EPR Fulltime EPR level 2 Response Standard Therapy Pressure - cmH2O Median: 5.4 95th percentile: 7.8 Maximum: 9.1 Leaks - L/min Median: 0.0 95th percentile: 3.0 Maximum: 7.0 Events per hour AI: 1.3 HI: 0.2 AHI: 1.5 Apnea Index Central: 1.1 Obstructive: 0.2 Unknown: 0.0 RERA Index 0.1 Cheyne-Stokes respiration (average duration per night) 0 minutes (0%) Usage - hours Printed on 04/26/2022 - ResMed AirView version 4.41.0-9.0 Page 1 of 1

## 2022-05-14 NOTE — Telephone Encounter (Signed)
The patient has been notified of the result and verbalized understanding.  All questions (if any) were answered. Marolyn Hammock, [place] 05/14/2022 6:55 PM.

## 2022-06-27 ENCOUNTER — Ambulatory Visit (INDEPENDENT_AMBULATORY_CARE_PROVIDER_SITE_OTHER): Payer: 59 | Admitting: Cardiology

## 2022-06-27 ENCOUNTER — Encounter: Payer: Self-pay | Admitting: Cardiology

## 2022-06-27 VITALS — BP 120/86 | HR 66 | Ht 69.0 in | Wt 206.0 lb

## 2022-06-27 DIAGNOSIS — G4733 Obstructive sleep apnea (adult) (pediatric): Secondary | ICD-10-CM

## 2022-06-27 NOTE — Progress Notes (Signed)
Sleep Medicine CONSULT Note    Date:  06/27/2022   ID:  Greg Cunningham, DOB 10/15/1966, MRN [id number]  PCP:  [name], MD  Cardiologist:  Minus Breeding, MD   Chief Complaint  Patient presents with   New Patient (Initial Visit)    OSA    History of Present Illness:  Greg Cunningham is a 56 y.o. male who is being seen today for the evaluation of obstructive sleep apnea at the request of Minus Breeding, MD.  This is a 56 year old male with a history of ADHD who was seen by [name] back in May for paroxysmal atrial fibrillation.  At that time a sleep study was done for a STOP-BANG score of 4 as well as complaints of snoring.  He tells me that he would feel sleepy upon awakening in the am and would feel very sleepy during the day.  His sleep study showed mild obstructive sleep apnea with an AHI of 10.4/h and O2 saturations as low as 88%.  He was placed on auto CPAP from 4 to 15 cm H2O and is now here for follow-up.  He is doing well with his CPAP device and thinks that he has gotten used to it.  He tolerates the nasal mask and feels the pressure is adequate.  Since going on CPAP he feels rested in the am and has no significant daytime sleepiness.  He denies any significant mouth or nasal dryness or nasal congestion.  He does not think that he snores.    Past Medical History:  Diagnosis Date   ADHD     Past Surgical History:  Procedure Laterality Date   None      Current Medications: Current Meds  Medication Sig   allopurinol (ZYLOPRIM) 300 MG tablet Take 300 mg by mouth daily.   Amphetamine ER (ADZENYS XR-ODT) 18.8 MG TBED Take 1 tablet by mouth every morning.   Ascorbic Acid (VITAMIN C) 500 MG CAPS Take 1 capsule by mouth daily.   Biotin 10 MG TABS Take 10 mg by mouth every other day. 1 Tablet Every Other Day   ciclopirox (PENLAC) 8 % solution Apply topically at bedtime.   fluticasone furoate-vilanterol (BREO ELLIPTA) 100-25 MCG/INH AEPB INHALE ONE PUFF BY MOUTH DAILY    loratadine (CLARITIN) 10 MG tablet Take 10 mg by mouth daily as needed.   melatonin 1 MG TABS tablet Take 1 mg by mouth at bedtime.   metoprolol tartrate (LOPRESSOR) 25 MG tablet TAKE 1 TABLET EVERY 6 HOURS FOR 2 DOSES DURING THE DAYTIME, MAY TAKE ADDITIONAL 1/2 TABLET AT NIGHT IF RECURRENT A-FIB    Allergies:   Patient has no known allergies.   Social History   Socioeconomic History   Marital status: Married    Spouse name: Not on file   Number of children: Not on file   Years of education: Not on file   Highest education level: Not on file  Occupational History   Not on file  Tobacco Use   Smoking status: Never    Passive exposure: Never   Smokeless tobacco: Never  Substance and Sexual Activity   Alcohol use: Not on file   Drug use: Never   Sexual activity: Yes    Partners: Female  Other Topics Concern   Not on file  Social History Narrative   Not on file   Social Determinants of Health   Financial Resource Strain: Not on file  Food Insecurity: Not on file  Transportation Needs: Not  on file  Physical Activity: Not on file  Stress: Not on file  Social Connections: Not on file     Family History:  The patient's family history is not on file.   ROS:   Please see the history of present illness.    ROS All other systems reviewed and are negative.      No data to display             PHYSICAL EXAM:   VS:  BP 120/86   Pulse 66   Ht '5\' 9"'$  (1.753 m)   Wt 206 lb (93.4 kg)   SpO2 99%   BMI 30.42 kg/m    GEN: Well nourished, well developed, in no acute distress  HEENT: normal  Neck: no JVD, carotid bruits, or masses Cardiac: RRR; no murmurs, rubs, or gallops,no edema.  Intact distal pulses bilaterally.  Respiratory:  clear to auscultation bilaterally, normal work of breathing GI: soft, nontender, nondistended, + BS MS: no deformity or atrophy  Skin: warm and dry, no rash Neuro:  Alert and Oriented x 3, Strength and sensation are intact Psych: euthymic  mood, full affect  Wt Readings from Last 3 Encounters:  06/27/22 206 lb (93.4 kg)  03/12/22 204 lb 6.4 oz (92.7 kg)  01/29/22 204 lb 9.6 oz (92.8 kg)      Studies/Labs Reviewed:   Home sleep study and PAP compliance download  Recent Labs: No results found for requested labs within last 365 days.    CHA2DS2-VASc Score = 0  15 This indicates a 0.2% annual risk of stroke. The patient's score is based upon: CHF History: 0 HTN History: 0 Diabetes History: 0 Stroke History: 0 Vascular Disease History: 0 Age Score: 0 Gender Score: 0     Additional studies/ records that were reviewed today include:  Office visit notes from [name]    ASSESSMENT:    1. OSA (obstructive sleep apnea)      PLAN:  In order of problems listed above:  OSA - The patient is tolerating PAP therapy well without any problems. The PAP download performed by his DME was personally reviewed and interpreted by me today and showed an AHI of 0.6 /hr on auto CPAP from 4-15 cm H2O with 100% compliance in using more than 4 hours nightly.  The patient has been using and benefiting from PAP use and will continue to benefit from therapy.    Time Spent: 15 minutes total time of encounter, including 10 minutes spent in face-to-face patient care on the date of this encounter. This time includes coordination of care and counseling regarding above mentioned problem list. Remainder of non-face-to-face time involved reviewing chart documents/testing relevant to the patient encounter and documentation in the medical record. I have independently reviewed documentation from referring provider  Medication Adjustments/Labs and Tests Ordered: Current medicines are reviewed at length with the patient today.  Concerns regarding medicines are outlined above.  Medication changes, Labs and Tests ordered today are listed in the Patient Instructions below.  There are no Patient Instructions on file for this  visit.   Signed, Fransico Him, MD  06/27/2022 10:43 AM    [facility] [place], [place], [place]  [address] Phone: [phone]; Fax: [phone]

## 2022-06-27 NOTE — Patient Instructions (Signed)

## 2022-09-06 ENCOUNTER — Other Ambulatory Visit: Payer: Self-pay | Admitting: Internal Medicine

## 2022-09-06 DIAGNOSIS — R911 Solitary pulmonary nodule: Secondary | ICD-10-CM

## 2022-10-15 ENCOUNTER — Ambulatory Visit
Admission: RE | Admit: 2022-10-15 | Discharge: 2022-10-15 | Disposition: A | Discharge status: Home / Self Care | Payer: 59 | Source: Ambulatory Visit | Attending: Internal Medicine | Admitting: Internal Medicine

## 2022-10-15 DIAGNOSIS — R911 Solitary pulmonary nodule: Secondary | ICD-10-CM

## 2023-03-28 LAB — LAB REPORT - SCANNED: HM Hepatitis Screen: NEGATIVE

## 2023-05-01 ENCOUNTER — Other Ambulatory Visit: Payer: Self-pay | Admitting: Cardiology

## 2023-05-01 DIAGNOSIS — I480 Paroxysmal atrial fibrillation: Secondary | ICD-10-CM

## 2023-07-28 ENCOUNTER — Other Ambulatory Visit: Payer: Self-pay | Admitting: Cardiology

## 2023-07-28 DIAGNOSIS — I480 Paroxysmal atrial fibrillation: Secondary | ICD-10-CM

## 2023-10-28 ENCOUNTER — Other Ambulatory Visit: Payer: Self-pay | Admitting: Cardiology

## 2023-10-28 DIAGNOSIS — I480 Paroxysmal atrial fibrillation: Secondary | ICD-10-CM

## 2023-11-11 ENCOUNTER — Other Ambulatory Visit: Payer: Self-pay | Admitting: Cardiology

## 2023-11-11 DIAGNOSIS — I480 Paroxysmal atrial fibrillation: Secondary | ICD-10-CM

## 2024-04-06 ENCOUNTER — Other Ambulatory Visit: Payer: Self-pay | Admitting: Cardiology

## 2024-04-06 DIAGNOSIS — I480 Paroxysmal atrial fibrillation: Secondary | ICD-10-CM

## 2024-04-20 ENCOUNTER — Encounter: Payer: Self-pay | Admitting: Cardiology

## 2024-04-20 NOTE — Telephone Encounter (Signed)
 Duplicate encounter- responded to pt in other encounter

## 2024-04-23 ENCOUNTER — Other Ambulatory Visit: Payer: Self-pay

## 2024-04-23 NOTE — Telephone Encounter (Signed)
 Received 2 different refill requests for Medication faxed back to [facility] for clarification

## 2024-04-29 ENCOUNTER — Ambulatory Visit: Attending: Cardiology | Admitting: Cardiology

## 2024-04-29 ENCOUNTER — Encounter: Payer: Self-pay | Admitting: Cardiology

## 2024-04-29 VITALS — BP 130/78 | HR 84 | Ht 69.0 in | Wt 204.6 lb

## 2024-04-29 DIAGNOSIS — G4733 Obstructive sleep apnea (adult) (pediatric): Secondary | ICD-10-CM | POA: Insufficient documentation

## 2024-04-29 NOTE — Patient Instructions (Signed)

## 2024-04-29 NOTE — Progress Notes (Signed)
 Sleep Medicine Note    Date:  04/29/2024   ID:  Laverne Klugh Landry, DOB 11/11/1965, MRN [id number]  PCP:  Clarice Nottingham, MD  Cardiologist:  Lynwood Schilling, MD   Chief Complaint  Patient presents with   Sleep Apnea    History of Present Illness:  Greg Cunningham is a 58 y.o. male with a history of ADHD who was seen by Dr. Schilling back in May for paroxysmal atrial fibrillation.  At that time a sleep study was done for a STOP-BANG score of 4 as well as complaints of snoring.  He tells me that he would feel sleepy upon awakening in the am and would feel very sleepy during the day.  His sleep study showed mild obstructive sleep apnea with an AHI of 10.4/h and O2 saturations as low as 88%.  He was placed on auto CPAP from 4 to 15 cm H2O and is now here for follow-up.  He is doing well with his PAP device and thinks that hd has gotten used to it.  He tolerates the nasal mask and feels the pressure is adequate.  Since going on PAP he feels rested in the am and has no significant daytime sleepiness.  He denies any significant mouth or nasal dryness or nasal congestion.  He does not think that he snores.     Past Medical History:  Diagnosis Date   ADHD    OSA on CPAP    mild obstructive sleep apnea with an AHI of 10.4/h and O2 saturations as low as 88%.  He was placed on auto CPAP from 4 to 15 cm H2O    Past Surgical History:  Procedure Laterality Date   None      Current Medications: Current Meds  Medication Sig   allopurinol (ZYLOPRIM) 300 MG tablet Take 300 mg by mouth daily.   Amphetamine ER (ADZENYS XR-ODT) 18.8 MG TBED Take 1 tablet by mouth every morning.   Ascorbic Acid (VITAMIN C) 500 MG CAPS Take 1 capsule by mouth daily.   Biotin 10 MG TABS Take 10 mg by mouth every other day. 1 Tablet Every Other Day   Cholecalciferol (VITAMIN D3) 1.25 MG (50000 UT) CAPS Take by mouth.   ciclopirox (PENLAC) 8 % solution Apply topically at bedtime.   fluticasone furoate-vilanterol (BREO  ELLIPTA) 100-25 MCG/INH AEPB INHALE ONE PUFF BY MOUTH DAILY   loratadine (CLARITIN) 10 MG tablet Take 10 mg by mouth daily as needed.   melatonin 1 MG TABS tablet Take 1 mg by mouth at bedtime.   metoprolol  tartrate (LOPRESSOR ) 25 MG tablet TAKE 1 TABLET EVERY 6 HOURS FOR 2 DOSES DURING THE DAYTIME, MAY TAKE ADDITIONAL 1/2 TABLET AT NIGHT IF RECURRENT A-FIB.    Allergies:   Patient has no known allergies.   Social History   Socioeconomic History   Marital status: Married    Spouse name: Not on file   Number of children: Not on file   Years of education: Not on file   Highest education level: Not on file  Occupational History   Not on file  Tobacco Use   Smoking status: Never    Passive exposure: Never   Smokeless tobacco: Never  Substance and Sexual Activity   Alcohol use: Not on file   Drug use: Never   Sexual activity: Yes    Partners: Female  Other Topics Concern   Not on file  Social History Narrative   Not on file   Social Drivers of  Health   Financial Resource Strain: Not on file  Food Insecurity: Not on file  Transportation Needs: Not on file  Physical Activity: Not on file  Stress: Not on file  Social Connections: Not on file     Family History:  The patient's family history is not on file.   ROS:   Please see the history of present illness.    ROS All other systems reviewed and are negative.      No data to display             PHYSICAL EXAM:   VS:  BP 130/78   Pulse 84   Ht 5' 9 (1.753 m)   Wt 204 lb 9.6 oz (92.8 kg)   SpO2 98%   BMI 30.21 kg/m    GEN: Well nourished, well developed in no acute distress HEENT: Normal NECK: No JVD; No carotid bruits LYMPHATICS: No lymphadenopathy CARDIAC:RRR, no murmurs, rubs, gallops RESPIRATORY:  Clear to auscultation without rales, wheezing or rhonchi  ABDOMEN: Soft, non-tender, non-distended MUSCULOSKELETAL:  No edema; No deformity  SKIN: Warm and dry NEUROLOGIC:  Alert and oriented x  3 PSYCHIATRIC:  Normal affect  Wt Readings from Last 3 Encounters:  04/29/24 204 lb 9.6 oz (92.8 kg)  06/27/22 206 lb (93.4 kg)  03/12/22 204 lb 6.4 oz (92.7 kg)      Studies/Labs Reviewed:   Home sleep study and PAP compliance download  Recent Labs: No results found for requested labs within last 365 days.    Additional studies/ records that were reviewed today include:  Office visit notes from Dr. Lavona    ASSESSMENT:    1. OSA (obstructive sleep apnea)       PLAN:  In order of problems listed above:  OSA - The patient is tolerating PAP therapy well without any problems. The PAP download performed by his DME was personally reviewed and interpreted by me today and showed an AHI of 0.6 /hr on auto CPAP from 5-15 cm H2O with 93 % compliance in using more than 4 hours nightly.  The patient has been using and benefiting from PAP use and will continue to benefit from therapy.    Time Spent: 15 minutes total time of encounter, including 10 minutes spent in face-to-face patient care on the date of this encounter. This time includes coordination of care and counseling regarding above mentioned problem list. Remainder of non-face-to-face time involved reviewing chart documents/testing relevant to the patient encounter and documentation in the medical record. I have independently reviewed documentation from referring provider  Medication Adjustments/Labs and Tests Ordered: Current medicines are reviewed at length with the patient today.  Concerns regarding medicines are outlined above.  Medication changes, Labs and Tests ordered today are listed in the Patient Instructions below.  There are no Patient Instructions on file for this visit.   Signed, Wilbert Bihari, MD  04/29/2024 3:08 PM    [facility] [address] [place], [place], [place]  [address] Phone: [phone]; Fax: [phone]

## 2024-05-03 ENCOUNTER — Encounter: Payer: Self-pay | Admitting: Cardiology

## 2024-05-04 ENCOUNTER — Other Ambulatory Visit: Payer: Self-pay | Admitting: *Deleted

## 2024-05-04 DIAGNOSIS — I480 Paroxysmal atrial fibrillation: Secondary | ICD-10-CM

## 2024-05-04 MED ORDER — METOPROLOL TARTRATE 25 MG PO TABS: ORAL_TABLET | ORAL | 3 refills | Status: DC

## 2024-06-11 NOTE — Progress Notes (Unsigned)
  Cardiology Office Note:   Date:  06/12/2024  ID:  Greg Cunningham, DOB 08/30/1966, MRN [id number]  PCP: Clarice Nottingham, MD  [facility] Providers Cardiologist:  Lynwood Schilling, MD Sleep Medicine:  Wilbert Bihari, MD {  History of Present Illness:   Greg Cunningham is a 58 y.o. male who was referred by Clarice Nottingham, MD for evaluaiton of tachycardia.  He was found on a Watch to have PAF.  After the last visit a couple years ago I sent him for sleep study and he was found to have sleep apnea and and has not had this treated.  He does feel better with management of his sleep apnea.  He has less fatigue.  He takes the beta blocker and does not feel any atrial fibs.  He has had no triggering as of his watch.  ROS:   As stated in the HPI and negative for all other systems.  Studies Reviewed:    EKG:   EKG Interpretation Date/Time:  Friday June 12 2024 16:32:47 EDT Ventricular Rate:  78 PR Interval:  194 QRS Duration:  72 QT Interval:  370 QTC Calculation: 421 R Axis:   27  Text Interpretation: Normal sinus rhythm  Borderline first degree AV block No previous ECGs available Confirmed by Schilling Lynwood ([id number]) on 06/12/2024 4:44:30 PM    Risk Assessment/Calculations:    CHA2DS2-VASc Score =     This indicates a  % annual risk of stroke. The patient's score is based upon:       Physical Exam:   VS:  BP 120/80 (BP Location: Left Arm, Patient Position: Sitting, Cuff Size: Normal)   Pulse 78   Ht 5' 9 (1.753 m)   Wt 205 lb 11.2 oz (93.3 kg)   SpO2 99%   BMI 30.38 kg/m    Wt Readings from Last 3 Encounters:  06/12/24 205 lb 11.2 oz (93.3 kg)  04/29/24 204 lb 9.6 oz (92.8 kg)  06/27/22 206 lb (93.4 kg)     GEN: Well nourished, well developed in no acute distress NECK: No JVD; No carotid bruits CARDIAC: RRR, no murmurs, rubs, gallops RESPIRATORY:  Clear to auscultation without rales, wheezing or rhonchi  ABDOMEN: Soft, non-tender, non-distended EXTREMITIES:  No edema; No  deformity   ASSESSMENT AND PLAN:   TACHYCARDIA:    He has PAF.  **    Mr. Greg Cunningham has a CHA2DS2 - VASc score of 0.   At this point no change in therapy is indicated.  He is not having particular symptoms.  He does need his metoprolol  12.5 mg twice daily and occasionally if he is having some palpitations he will take the extra dose.    SNORING: He has done well with CPAP.  No change in therapy.     Follow up with me in two years.   Signed, Lynwood Schilling, MD

## 2024-06-12 ENCOUNTER — Encounter: Payer: Self-pay | Admitting: Cardiology

## 2024-06-12 ENCOUNTER — Ambulatory Visit: Attending: Cardiology | Admitting: Cardiology

## 2024-06-12 VITALS — BP 120/80 | HR 78 | Ht 69.0 in | Wt 205.7 lb

## 2024-06-12 DIAGNOSIS — I480 Paroxysmal atrial fibrillation: Secondary | ICD-10-CM | POA: Diagnosis not present

## 2024-06-12 DIAGNOSIS — R000 Tachycardia, unspecified: Secondary | ICD-10-CM | POA: Diagnosis not present

## 2024-06-12 MED ORDER — METOPROLOL TARTRATE 25 MG PO TABS: ORAL_TABLET | ORAL | 3 refills | Status: AC

## 2024-06-12 NOTE — Patient Instructions (Signed)
 Medication Instructions:  Refilled Metoprolol  Tartrate  *If you need a refill on your cardiac medications before your next appointment, please call your pharmacy*  Lab Work: NONE If you have labs (blood work) drawn today and your tests are completely normal, you will receive your results only by: MyChart Message (if you have MyChart) OR A paper copy in the mail If you have any lab test that is abnormal or we need to change your treatment, we will call you to review the results.  Testing/Procedures: NONE  Follow-Up: At [facility], you and your health needs are our priority.  As part of our continuing mission to provide you with exceptional heart care, our providers are all part of one team.  This team includes your primary Cardiologist (physician) and Advanced Practice Providers or APPs (Physician Assistants and Nurse Practitioners) who all work together to provide you with the care you need, when you need it.  Your next appointment:   2 year(s)  Provider:   Lynwood Schilling, MD    We recommend signing up for the patient portal called MyChart.  Sign up information is provided on this After Visit Summary.  MyChart is used to connect with patients for Virtual Visits (Telemedicine).  Patients are able to view lab/test results, encounter notes, upcoming appointments, etc.  Non-urgent messages can be sent to your provider as well.   To learn more about what you can do with MyChart, go to [email or web address].

## 2024-06-24 ENCOUNTER — Ambulatory Visit: Admitting: Cardiology

## 2024-07-03 ENCOUNTER — Ambulatory Visit: Admitting: Cardiology
